# Patient Record
Sex: Female | Born: 1965 | Race: White | Hispanic: No | Smoking: Never smoker
Health system: Southern US, Community
[De-identification: ages and names within clinical notes are randomized; demographics above are authoritative.]

## PROBLEM LIST (undated history)

## (undated) DIAGNOSIS — Z8742 Personal history of other diseases of the female genital tract: Secondary | ICD-10-CM

## (undated) DIAGNOSIS — Z973 Presence of spectacles and contact lenses: Secondary | ICD-10-CM

## (undated) DIAGNOSIS — R519 Headache, unspecified: Secondary | ICD-10-CM

## (undated) DIAGNOSIS — Z9889 Other specified postprocedural states: Secondary | ICD-10-CM

## (undated) DIAGNOSIS — T4145XA Adverse effect of unspecified anesthetic, initial encounter: Secondary | ICD-10-CM

## (undated) DIAGNOSIS — R51 Headache: Secondary | ICD-10-CM

## (undated) DIAGNOSIS — T8859XA Other complications of anesthesia, initial encounter: Secondary | ICD-10-CM

## (undated) DIAGNOSIS — R112 Nausea with vomiting, unspecified: Secondary | ICD-10-CM

## (undated) HISTORY — PX: TONSILLECTOMY: SUR1361

## (undated) HISTORY — PX: DILATION AND CURETTAGE OF UTERUS: SHX78

---

## 2004-11-22 ENCOUNTER — Ambulatory Visit: Payer: Self-pay | Admitting: *Deleted

## 2006-03-21 ENCOUNTER — Ambulatory Visit: Payer: Self-pay | Admitting: Ophthalmology

## 2010-08-01 HISTORY — PX: OVARY SURGERY: SHX727

## 2012-04-10 ENCOUNTER — Ambulatory Visit: Payer: Self-pay | Admitting: *Deleted

## 2013-05-19 ENCOUNTER — Emergency Department: Payer: Self-pay | Admitting: Emergency Medicine

## 2013-05-19 LAB — URINALYSIS, COMPLETE
Bacteria: NONE SEEN
Bilirubin,UR: NEGATIVE
Glucose,UR: NEGATIVE mg/dL (ref 0–75)
Leukocyte Esterase: NEGATIVE
Nitrite: NEGATIVE
Ph: 6 (ref 4.5–8.0)
Protein: NEGATIVE
RBC,UR: 8 /HPF (ref 0–5)
Specific Gravity: 1.015 (ref 1.003–1.030)
Squamous Epithelial: 1
WBC UR: 1 /HPF (ref 0–5)

## 2013-05-19 LAB — CBC WITH DIFFERENTIAL/PLATELET
Basophil #: 0 10*3/uL (ref 0.0–0.1)
Eosinophil #: 0 10*3/uL (ref 0.0–0.7)
HCT: 40.6 % (ref 35.0–47.0)
HGB: 13.8 g/dL (ref 12.0–16.0)
Lymphocyte #: 0.6 10*3/uL — ABNORMAL LOW (ref 1.0–3.6)
Lymphocyte %: 5.8 %
MCH: 31.8 pg (ref 26.0–34.0)
MCHC: 34.1 g/dL (ref 32.0–36.0)
MCV: 93 fL (ref 80–100)
Monocyte #: 0.4 x10 3/mm (ref 0.2–0.9)
Monocyte %: 4.4 %
Neutrophil %: 89.2 %
Platelet: 183 10*3/uL (ref 150–440)
RBC: 4.35 10*6/uL (ref 3.80–5.20)
WBC: 9.8 10*3/uL (ref 3.6–11.0)

## 2013-05-19 LAB — BASIC METABOLIC PANEL
BUN: 14 mg/dL (ref 7–18)
Calcium, Total: 9.3 mg/dL (ref 8.5–10.1)
Chloride: 105 mmol/L (ref 98–107)
Co2: 28 mmol/L (ref 21–32)
Creatinine: 0.81 mg/dL (ref 0.60–1.30)
EGFR (Non-African Amer.): >60
Glucose: 146 mg/dL — ABNORMAL HIGH (ref 65–99)
Osmolality: 279 (ref 275–301)
Potassium: 3.7 mmol/L (ref 3.5–5.1)

## 2014-09-03 ENCOUNTER — Other Ambulatory Visit: Payer: Self-pay | Admitting: Orthopaedic Surgery

## 2014-09-08 NOTE — H&P (Signed)
Linda GrumblesDonna [name] is an 49 y.o. female.   Chief Complaint: Left Shoulder Pain AVW:[id number]:Signe continues with shoulder pain and she has been through the MRI scan since last time she was in. She continues in an exercise program and in formal therapy elsewhere. She feels her motion is better but she persists with the discomfort. Her pain is intermittent and severe.   MRI:  I reviewed an MRI scan films and report of a study done at [facility] on 08/26/14. She has some irritation of the supraspinatus tendon near the greater tuberosity.  No past medical history on file.  No past surgical history on file.  No family history on file. Social History:  has no tobacco, alcohol, and drug history on file.  Allergies: Allergies not on file  No prescriptions prior to admission    No results found for this or any previous visit (from the past 48 hour(s)). No results found.  Review of Systems  Musculoskeletal: Positive for joint pain.       Left shoulder.  All other systems reviewed and are negative.   There were no vitals taken for this visit. Physical Exam  Constitutional: She is oriented to person, place, and time. She appears well-developed and well-nourished.  HENT:  Head: Normocephalic and atraumatic.  Eyes: Conjunctivae are normal. Pupils are equal, round, and reactive to light.  Neck: Normal range of motion.  Cardiovascular: Normal rate and regular rhythm.   Respiratory: Effort normal.  GI: Soft.  Musculoskeletal:  Left shoulder motion is a little bit improved. She has forward flexion to maybe 110 with external rotation to about 45 compared to 60 on the opposite side. Her internal rotation is to her back pocket. She has impingement in both positions though cuff strength is fairly good. There is no pain at her [facility] joint. Cervical motion is full there is no palpable lymphadenopathy.   Neurological: She is alert and oriented to person, place, and time.  Skin: Skin is warm and dry.   Psychiatric: She has a normal mood and affect. Her behavior is normal. Judgment and thought content normal.     Assessment/Plan Assessment: Left shoulder adhesive capsulitis injected x2  Plan: Lupita LeashDonna has some irritation of her rotator cuff and a resolving adhesive capsulitis situation. She has been symptomatic for about a year. This persists despite supervised physical therapy and 2 injections. I think we can improve her situation with an arthroscopy and manipulation. I reviewed risk of anesthesia and infection related to this intervention. Our plan will be to perform a manipulation and an acromioplasty and debridement of the irritated cuff. We will then get her back in therapy in an expeditious fashion.  Arrin Pintor, Ginger OrganNDREW PAUL 09/08/2014, 9:52 AM

## 2014-09-10 ENCOUNTER — Encounter (HOSPITAL_BASED_OUTPATIENT_CLINIC_OR_DEPARTMENT_OTHER): Payer: Self-pay | Admitting: *Deleted

## 2014-09-12 ENCOUNTER — Encounter (HOSPITAL_BASED_OUTPATIENT_CLINIC_OR_DEPARTMENT_OTHER): Payer: Self-pay | Admitting: Certified Registered"

## 2014-09-12 ENCOUNTER — Encounter (HOSPITAL_BASED_OUTPATIENT_CLINIC_OR_DEPARTMENT_OTHER)
Admission: RE | Disposition: A | Discharge status: Home / Self Care | Payer: Self-pay | Source: Ambulatory Visit | Attending: Orthopaedic Surgery

## 2014-09-12 ENCOUNTER — Ambulatory Visit (HOSPITAL_BASED_OUTPATIENT_CLINIC_OR_DEPARTMENT_OTHER): Payer: 59 | Admitting: Certified Registered"

## 2014-09-12 ENCOUNTER — Ambulatory Visit (HOSPITAL_BASED_OUTPATIENT_CLINIC_OR_DEPARTMENT_OTHER)
Admission: RE | Admit: 2014-09-12 | Discharge: 2014-09-12 | Disposition: A | Discharge status: Home / Self Care | Payer: 59 | Source: Ambulatory Visit | Attending: Orthopaedic Surgery | Admitting: Orthopaedic Surgery

## 2014-09-12 DIAGNOSIS — M25812 Other specified joint disorders, left shoulder: Secondary | ICD-10-CM | POA: Insufficient documentation

## 2014-09-12 DIAGNOSIS — M7502 Adhesive capsulitis of left shoulder: Secondary | ICD-10-CM | POA: Insufficient documentation

## 2014-09-12 DIAGNOSIS — M25512 Pain in left shoulder: Secondary | ICD-10-CM | POA: Diagnosis present

## 2014-09-12 HISTORY — DX: Adverse effect of unspecified anesthetic, initial encounter: T41.45XA

## 2014-09-12 HISTORY — PX: SHOULDER ACROMIOPLASTY: SHX6093

## 2014-09-12 HISTORY — DX: Personal history of other diseases of the female genital tract: Z87.42

## 2014-09-12 HISTORY — DX: Presence of spectacles and contact lenses: Z97.3

## 2014-09-12 HISTORY — PX: SHOULDER ARTHROSCOPY: SHX128

## 2014-09-12 HISTORY — DX: Nausea with vomiting, unspecified: R11.2

## 2014-09-12 HISTORY — DX: Nausea with vomiting, unspecified: Z98.890

## 2014-09-12 HISTORY — DX: Other complications of anesthesia, initial encounter: T88.59XA

## 2014-09-12 LAB — POCT HEMOGLOBIN-HEMACUE: HEMOGLOBIN: 12.3 g/dL (ref 12.0–15.0)

## 2014-09-12 SURGERY — ARTHROSCOPY, SHOULDER
Anesthesia: Regional | Site: Shoulder | Laterality: Left

## 2014-09-12 MED ORDER — DEXAMETHASONE SODIUM PHOSPHATE 4 MG/ML IJ SOLN
INTRAMUSCULAR | Status: DC | PRN
  Administered 2014-09-12: 10 mg via INTRAVENOUS

## 2014-09-12 MED ORDER — MIDAZOLAM HCL 2 MG/2ML IJ SOLN
1.0000 mg | INTRAMUSCULAR | Status: DC | PRN
  Administered 2014-09-12: 2 mg via INTRAVENOUS

## 2014-09-12 MED ORDER — HYDROMORPHONE HCL 1 MG/ML IJ SOLN: 0.2500 mg | INTRAMUSCULAR | Status: DC | PRN

## 2014-09-12 MED ORDER — BUPIVACAINE-EPINEPHRINE (PF) 0.5% -1:200000 IJ SOLN
INTRAMUSCULAR | Status: DC | PRN
  Administered 2014-09-12: 30 mL via PERINEURAL

## 2014-09-12 MED ORDER — FENTANYL CITRATE 0.05 MG/ML IJ SOLN
INTRAMUSCULAR | Status: AC
  Filled 2014-09-12: qty 6

## 2014-09-12 MED ORDER — ONDANSETRON HCL 4 MG/2ML IJ SOLN
INTRAMUSCULAR | Status: DC | PRN
  Administered 2014-09-12: 4 mg via INTRAVENOUS

## 2014-09-12 MED ORDER — LACTATED RINGERS IV SOLN: INTRAVENOUS | Status: DC

## 2014-09-12 MED ORDER — HYDROCODONE-ACETAMINOPHEN 5-325 MG PO TABS: 1.0000 | ORAL_TABLET | Freq: Four times a day (QID) | ORAL | Status: AC | PRN

## 2014-09-12 MED ORDER — CEFAZOLIN SODIUM-DEXTROSE 2-3 GM-% IV SOLR
INTRAVENOUS | Status: AC
Start: 2014-09-12 — End: 2014-09-12
  Filled 2014-09-12: qty 50

## 2014-09-12 MED ORDER — FENTANYL CITRATE 0.05 MG/ML IJ SOLN
50.0000 ug | INTRAMUSCULAR | Status: DC | PRN
  Administered 2014-09-12: 100 ug via INTRAVENOUS

## 2014-09-12 MED ORDER — SUCCINYLCHOLINE CHLORIDE 20 MG/ML IJ SOLN
INTRAMUSCULAR | Status: DC | PRN
  Administered 2014-09-12: 100 mg via INTRAVENOUS

## 2014-09-12 MED ORDER — MIDAZOLAM HCL 2 MG/2ML IJ SOLN
INTRAMUSCULAR | Status: AC
  Filled 2014-09-12: qty 2

## 2014-09-12 MED ORDER — FENTANYL CITRATE 0.05 MG/ML IJ SOLN
INTRAMUSCULAR | Status: AC
  Filled 2014-09-12: qty 2

## 2014-09-12 MED ORDER — CHLORHEXIDINE GLUCONATE 4 % EX LIQD: 60.0000 mL | Freq: Once | CUTANEOUS | Status: DC

## 2014-09-12 MED ORDER — PROPOFOL 10 MG/ML IV EMUL
INTRAVENOUS | Status: AC
  Filled 2014-09-12: qty 50

## 2014-09-12 MED ORDER — MEPERIDINE HCL 25 MG/ML IJ SOLN: 6.2500 mg | INTRAMUSCULAR | Status: DC | PRN

## 2014-09-12 MED ORDER — LIDOCAINE HCL (CARDIAC) 20 MG/ML IV SOLN
INTRAVENOUS | Status: DC | PRN
  Administered 2014-09-12: 100 mg via INTRAVENOUS

## 2014-09-12 MED ORDER — LACTATED RINGERS IV SOLN
INTRAVENOUS | Status: DC
  Administered 2014-09-12 (×2): via INTRAVENOUS

## 2014-09-12 MED ORDER — PROPOFOL 10 MG/ML IV BOLUS
INTRAVENOUS | Status: DC | PRN
  Administered 2014-09-12: 150 mg via INTRAVENOUS

## 2014-09-12 MED ORDER — SCOPOLAMINE 1 MG/3DAYS TD PT72
MEDICATED_PATCH | TRANSDERMAL | Status: AC
  Filled 2014-09-12: qty 1

## 2014-09-12 MED ORDER — SCOPOLAMINE 1 MG/3DAYS TD PT72
1.0000 | MEDICATED_PATCH | TRANSDERMAL | Status: DC
  Administered 2014-09-12: 1.5 mg via TRANSDERMAL

## 2014-09-12 MED ORDER — CEFAZOLIN SODIUM-DEXTROSE 2-3 GM-% IV SOLR
2.0000 g | INTRAVENOUS | Status: AC
  Administered 2014-09-12: 2 g via INTRAVENOUS

## 2014-09-12 MED ORDER — CEFAZOLIN SODIUM-DEXTROSE 2-3 GM-% IV SOLR
INTRAVENOUS | Status: AC
  Filled 2014-09-12: qty 50

## 2014-09-12 MED ORDER — ONDANSETRON HCL 4 MG/2ML IJ SOLN: 4.0000 mg | Freq: Once | INTRAMUSCULAR | Status: DC | PRN

## 2014-09-12 SURGICAL SUPPLY — 65 items
BENZOIN TINCTURE PRP APPL 2/3 (GAUZE/BANDAGES/DRESSINGS) IMPLANT
BLADE CUDA 5.5 (BLADE) IMPLANT
BLADE GREAT WHITE 4.2 (BLADE) ×2 IMPLANT
BLADE SURG 15 STRL LF DISP TIS (BLADE) IMPLANT
BLADE SURG 15 STRL SS (BLADE)
BUR VERTEX HOODED 4.5 (BURR) IMPLANT
CANNULA SHOULDER 7CM (CANNULA) ×2 IMPLANT
CANNULA TWIST IN 8.25X7CM (CANNULA) IMPLANT
DECANTER SPIKE VIAL GLASS SM (MISCELLANEOUS) IMPLANT
DRAPE STERI 35X30 U-POUCH (DRAPES) ×2 IMPLANT
DRAPE U-SHAPE 47X51 STRL (DRAPES) ×2 IMPLANT
DRAPE U-SHAPE 76X120 STRL (DRAPES) ×4 IMPLANT
DRSG EMULSION OIL 3X3 NADH (GAUZE/BANDAGES/DRESSINGS) ×2 IMPLANT
DRSG PAD ABDOMINAL 8X10 ST (GAUZE/BANDAGES/DRESSINGS) ×2 IMPLANT
DURAPREP 26ML APPLICATOR (WOUND CARE) ×2 IMPLANT
ELECT MENISCUS 165MM 90D (ELECTRODE) IMPLANT
ELECT REM PT RETURN 9FT ADLT (ELECTROSURGICAL) ×2
ELECTRODE REM PT RTRN 9FT ADLT (ELECTROSURGICAL) ×1 IMPLANT
GAUZE SPONGE 4X4 12PLY STRL (GAUZE/BANDAGES/DRESSINGS) ×2 IMPLANT
GLOVE BIO SURGEON STRL SZ 6.5 (GLOVE) ×4 IMPLANT
GLOVE BIO SURGEON STRL SZ8 (GLOVE) ×4 IMPLANT
GLOVE BIOGEL PI IND STRL 7.0 (GLOVE) ×1 IMPLANT
GLOVE BIOGEL PI IND STRL 8 (GLOVE) ×2 IMPLANT
GLOVE BIOGEL PI INDICATOR 7.0 (GLOVE) ×1
GLOVE BIOGEL PI INDICATOR 8 (GLOVE) ×2
GOWN STRL REUS W/ TWL LRG LVL3 (GOWN DISPOSABLE) ×2 IMPLANT
GOWN STRL REUS W/ TWL XL LVL3 (GOWN DISPOSABLE) ×2 IMPLANT
GOWN STRL REUS W/TWL LRG LVL3 (GOWN DISPOSABLE) ×2
GOWN STRL REUS W/TWL XL LVL3 (GOWN DISPOSABLE) ×2
LIQUID BAND (GAUZE/BANDAGES/DRESSINGS) IMPLANT
MANIFOLD NEPTUNE II (INSTRUMENTS) ×2 IMPLANT
NDL SUT 6 .5 CRC .975X.05 MAYO (NEEDLE) IMPLANT
NEEDLE MAYO TAPER (NEEDLE)
NEEDLE SCORPION MULTI FIRE (NEEDLE) IMPLANT
NS IRRIG 1000ML POUR BTL (IV SOLUTION) IMPLANT
PACK ARTHROSCOPY DSU (CUSTOM PROCEDURE TRAY) ×2 IMPLANT
PACK BASIN DAY SURGERY FS (CUSTOM PROCEDURE TRAY) ×2 IMPLANT
PASSER SUT SWANSON 36MM LOOP (INSTRUMENTS) IMPLANT
PENCIL BUTTON HOLSTER BLD 10FT (ELECTRODE) IMPLANT
SET ARTHROSCOPY TUBING (MISCELLANEOUS) ×1
SET ARTHROSCOPY TUBING LN (MISCELLANEOUS) ×1 IMPLANT
SHEET MEDIUM DRAPE 40X70 STRL (DRAPES) ×2 IMPLANT
SLEEVE SCD COMPRESS KNEE MED (MISCELLANEOUS) ×2 IMPLANT
SLING ARM LRG ADULT FOAM STRAP (SOFTGOODS) IMPLANT
SLING ARM MED ADULT FOAM STRAP (SOFTGOODS) IMPLANT
SLING ARM SM FOAM STRAP (SOFTGOODS) IMPLANT
SLING ARM XL FOAM STRAP (SOFTGOODS) IMPLANT
SPONGE LAP 4X18 X RAY DECT (DISPOSABLE) IMPLANT
STRIP CLOSURE SKIN 1/2X4 (GAUZE/BANDAGES/DRESSINGS) IMPLANT
SUCTION FRAZIER TIP 10 FR DISP (SUCTIONS) IMPLANT
SUT ETHIBOND 2 OS 4 DA (SUTURE) IMPLANT
SUT ETHILON 3 0 PS 1 (SUTURE) ×2 IMPLANT
SUT FIBERWIRE #2 38 T-5 BLUE (SUTURE)
SUT PDS AB 2-0 CT2 27 (SUTURE) IMPLANT
SUT VIC AB 0 SH 27 (SUTURE) IMPLANT
SUT VIC AB 2-0 SH 27 (SUTURE)
SUT VIC AB 2-0 SH 27XBRD (SUTURE) IMPLANT
SUT VICRYL 4-0 PS2 18IN ABS (SUTURE) IMPLANT
SUTURE FIBERWR #2 38 T-5 BLUE (SUTURE) IMPLANT
SYR BULB 3OZ (MISCELLANEOUS) IMPLANT
TOWEL OR 17X24 6PK STRL BLUE (TOWEL DISPOSABLE) ×2 IMPLANT
TOWEL OR NON WOVEN STRL DISP B (DISPOSABLE) ×2 IMPLANT
WAND STAR VAC 90 (SURGICAL WAND) ×2 IMPLANT
WATER STERILE IRR 1000ML POUR (IV SOLUTION) ×2 IMPLANT
YANKAUER SUCT BULB TIP NO VENT (SUCTIONS) IMPLANT

## 2014-09-12 NOTE — Interval H&P Note (Signed)
OK for surgery PD 

## 2014-09-12 NOTE — Transfer of Care (Signed)
Immediate Anesthesia Transfer of Care Note  Patient: Linda [name]  Procedure(s) Performed: Procedure(s): ARTHROSCOPY SHOULDER WITH MANIPULATION (Left) SHOULDER ACROMIOPLASTY (Left)  Patient Location: PACU  Anesthesia Type:GA combined with regional for post-op pain  Level of Consciousness: awake, sedated and responds to stimulation  Airway & Oxygen Therapy: Patient Spontanous Breathing and Patient connected to face mask oxygen  Post-op Assessment: Report given to RN, Post -op Vital signs reviewed and stable and Patient moving all extremities  Post vital signs: Reviewed and stable  Last Vitals:  Filed Vitals:   09/12/14 1230  BP: 104/70  Pulse: 80  Temp:   Resp: 17    Complications: No apparent anesthesia complications

## 2014-09-12 NOTE — Anesthesia Preprocedure Evaluation (Signed)
Anesthesia Evaluation  Patient identified by MRN, date of birth, ID band Patient awake    Reviewed: Allergy & Precautions, NPO status , Patient's Chart, lab work & pertinent test results  History of Anesthesia Complications (+) PONV  Airway Mallampati: I  TM Distance: >3 FB Neck ROM: Full    Dental   Pulmonary          Cardiovascular     Neuro/Psych    GI/Hepatic   Endo/Other    Renal/GU      Musculoskeletal   Abdominal   Peds  Hematology   Anesthesia Other Findings   Reproductive/Obstetrics                             Anesthesia Physical Anesthesia Plan  ASA: II  Anesthesia Plan: General   Post-op Pain Management:    Induction: Intravenous  Airway Management Planned: Oral ETT  Additional Equipment:   Intra-op Plan:   Post-operative Plan: Extubation in OR  Informed Consent: I have reviewed the patients History and Physical, chart, labs and discussed the procedure including the risks, benefits and alternatives for the proposed anesthesia with the patient or authorized representative who has indicated his/her understanding and acceptance.     Plan Discussed with: CRNA and Surgeon  Anesthesia Plan Comments:         Anesthesia Quick Evaluation  

## 2014-09-12 NOTE — Op Note (Signed)
#  [id number] 

## 2014-09-12 NOTE — Anesthesia Postprocedure Evaluation (Signed)
Anesthesia Post Note  Patient: Linda GrumblesDonna Howdeshell  Procedure(s) Performed: Procedure(s) (LRB): ARTHROSCOPY SHOULDER WITH MANIPULATION (Left) SHOULDER ACROMIOPLASTY (Left)  Anesthesia type: general  Patient location: PACU  Post pain: Pain level controlled  Post assessment: Patient's Cardiovascular Status Stable  Last Vitals:  Filed Vitals:   09/12/14 1545  BP: 120/79  Pulse: 65  Temp: 36.6 C  Resp: 20    Post vital signs: Reviewed and stable  Level of consciousness: sedated  Complications: No apparent anesthesia complications

## 2014-09-12 NOTE — Discharge Instructions (Signed)
°  Post Anesthesia Home Care Instructions ° °Activity: °Get plenty of rest for the remainder of the day. A responsible adult should stay with you for 24 hours following the procedure.  °For the next 24 hours, DO NOT: °-Drive a car °-Operate machinery °-Drink alcoholic beverages °-Take any medication unless instructed by your physician °-Make any legal decisions or sign important papers. ° °Meals: °Start with liquid foods such as gelatin or soup. Progress to regular foods as tolerated. Avoid greasy, spicy, heavy foods. If nausea and/or vomiting occur, drink only clear liquids until the nausea and/or vomiting subsides. Call your physician if vomiting continues. ° °Special Instructions/Symptoms: °Your throat may feel dry or sore from the anesthesia or the breathing tube placed in your throat during surgery. If this causes discomfort, gargle with warm salt water. The discomfort should disappear within 24 hours. ° °Regional Anesthesia Blocks ° °1. Numbness or the inability to move the "blocked" extremity may last from 3-48 hours after placement. The length of time depends on the medication injected and your individual response to the medication. If the numbness is not going away after 48 hours, call your surgeon. ° °2. The extremity that is blocked will need to be protected until the numbness is gone and the  Strength has returned. Because you cannot feel it, you will need to take extra care to avoid injury. Because it may be weak, you may have difficulty moving it or using it. You may not know what position it is in without looking at it while the block is in effect. ° °3. For blocks in the legs and feet, returning to weight bearing and walking needs to be done carefully. You will need to wait until the numbness is entirely gone and the strength has returned. You should be able to move your leg and foot normally before you try and bear weight or walk. You will need someone to be with you when you first try to ensure you  do not fall and possibly risk injury. ° °4. Bruising and tenderness at the needle site are common side effects and will resolve in a few days. ° °5. Persistent numbness or new problems with movement should be communicated to the surgeon or the [facility] ([phone])/ [facility] ([phone]). °

## 2014-09-12 NOTE — Progress Notes (Signed)
AssistedDr. Ossey with left, ultrasound guided, interscalene  block. Side rails up, monitors on throughout procedure. See vital signs in flow sheet. Tolerated Procedure well.  

## 2014-09-12 NOTE — Anesthesia Procedure Notes (Addendum)
Anesthesia Regional Block:  Interscalene brachial plexus block  Pre-Anesthetic Checklist: ,, timeout performed, Correct Patient, Correct Site, Correct Laterality, Correct Procedure, Correct Position, site marked, Risks and benefits discussed,  Surgical consent,  Pre-op evaluation,  At surgeon's request and post-op pain management  Laterality: Left  Prep: chloraprep       Needles:  Injection technique: Single-shot  Needle Type: Echogenic Stimulator Needle     Needle Length: 9cm 9 cm Needle Gauge: 21 and 21 G    Additional Needles:  Procedures: ultrasound guided (picture in chart) and nerve stimulator Interscalene brachial plexus block  Nerve Stimulator or Paresthesia:  Response: 0.4 mA,   Additional Responses:   Narrative:  Start time: 09/12/2014 11:35 AM End time: 09/12/2014 11:45 AM Injection made incrementally with aspirations every 5 mL. Anesthesiologist: OSSEY, KEVIN  Additional Notes: Monitors applied. Patient sedated. Sterile prep and drape,hand hygiene and sterile gloves were used. Relevant anatomy identified.Needle position confirmed.Local anesthetic injected incrementally after negative aspiration. Local anesthetic spread visualized around nerve(s). Vascular puncture avoided. No complications. Image printed for medical record.The patient tolerated the procedure well.        Procedure Name: Intubation Date/Time: 09/12/2014 1:26 PM Performed by: CRAFT, JANET W Pre-anesthesia Checklist: Patient identified, Emergency Drugs available, Suction available and Patient being monitored Patient Re-evaluated:Patient Re-evaluated prior to inductionOxygen Delivery Method: Circle System Utilized Preoxygenation: Pre-oxygenation with 100% oxygen Intubation Type: IV induction Ventilation: Mask ventilation without difficulty Laryngoscope Size: Miller and 2 Grade View: Grade I Tube type: Oral Tube size: 7.0 mm Number of attempts: 1 Airway Equipment and Method: Stylet and LTA  kit utilized Placement Confirmation: ETT inserted through vocal cords under direct vision,  positive ETCO2 and breath sounds checked- equal and bilateral Secured at: 22 cm Tube secured with: Tape Dental Injury: Teeth and Oropharynx as per pre-operative assessment        

## 2014-09-15 NOTE — Op Note (Signed)
NAMECHRISTIANN, [name]                 ACCOUNT NO.:  [id number]  MEDICAL RECORD NO.:  [id number]  LOCATION:                                 FACILITY:  PHYSICIAN:  Lubertha Basque. Denilson Salminen, M.D.DATE OF BIRTH:  03-22-1966  DATE OF PROCEDURE:  09/12/2014 DATE OF DISCHARGE:  09/12/2014                              OPERATIVE REPORT   PREOPERATIVE DIAGNOSES: 1. Left shoulder impingement. 2. Left shoulder adhesive capsulitis.  POSTOPERATIVE DIAGNOSES: 1. Left shoulder impingement. 2. Left shoulder adhesive capsulitis.  PROCEDURE: 1. Left shoulder closed manipulation. 2. Left shoulder arthroscopic acromioplasty. 3. Left shoulder arthroscopic debridement.  ANESTHESIA:  General and block.  ATTENDING SURGEON:  Lubertha Basque. Jerl Santos, M.D.  ASSISTANT:  Elodia Florence, PA-C  INDICATION FOR PROCEDURE:  The patient is a 49 year old woman, who struggled for about a year with some left shoulder issues.  She has persisted with stiffness and pain despite multiple injections and therapy.  By MRI scan, she has things consistent with irritation of her rotator cuff and adhesive capsulitis.  At this point, she is offered a manipulation and arthroscopy.  Informed operative consent was obtained after discussion of possible complications including reaction to anesthesia and infection.  SUMMARY OF FINDINGS AND PROCEDURE:  Under general anesthesia and a block, a left shoulder manipulation was performed followed by an arthroscopy.  At manipulation, she did have some mild adhesions at the end range of forward flexion and external rotation which we were able to overcome.  At arthroscopy, she had no degenerative change in the glenohumeral joint and a normal-appearing rotator cuff and biceps tendon.  The interval appeared to be open.  In the subacromial space, she had a great deal of irritation and bursitis involving the bursal aspect of her cuff and a thorough debridement was done.  She had a moderately prominent  subacromial morphology addressed with an acromioplasty.  She was discharged home same day.  DESCRIPTION OF PROCEDURE:  The patient was taken to the operating suite where general anesthetic was applied without difficulty.  She was also given a block in the pre-anesthesia area.  She was positioned in beach- chair position and prepped and draped in normal sterile fashion.  After the administration of preop IV Kefzol and an appropriate time-out, an arthroscopy of the left shoulder was performed through total of 2 portals.  Findings were as noted above and procedure consisted predominantly of the acromioplasty which was done with a bur in the lateral position followed by transfer of the bur to the posterior position.  Also performed debridement mostly of the bursal aspect of her cuff.  The manipulation preoperatively did restore the end range of forward flexion and external rotation easily.  The shoulder was thoroughly irrigated followed by removal of arthroscopic equipment. Simple sutures of nylon were loosely placed on the portals followed by Adaptic, dry gauze and tape.  Estimated blood loss and fluids obtained from anesthesia records.  DISPOSITION:  The patient was extubated in the operating room and taken to recovery room in stable condition.  She was to go home same-day and follow up in the office in less than a week.  I will contact her by phone tonight.  Lubertha BasquePeter G. Jerl Santosalldorf, M.D.     PGD/MEDQ  D:  09/12/2014  T:  09/13/2014  Job:  [id number]

## 2014-09-16 ENCOUNTER — Encounter (HOSPITAL_BASED_OUTPATIENT_CLINIC_OR_DEPARTMENT_OTHER): Payer: Self-pay | Admitting: Orthopaedic Surgery

## 2015-10-26 ENCOUNTER — Ambulatory Visit
Admission: EM | Admit: 2015-10-26 | Discharge: 2015-10-26 | Disposition: A | Discharge status: Home / Self Care | Payer: 59 | Attending: Emergency Medicine | Admitting: Emergency Medicine

## 2015-10-26 DIAGNOSIS — R6889 Other general symptoms and signs: Secondary | ICD-10-CM

## 2015-10-26 LAB — RAPID INFLUENZA A&B ANTIGENS (ARMC ONLY)
INFLUENZA A (ARMC): NEGATIVE
INFLUENZA B (ARMC): NEGATIVE

## 2015-10-26 MED ORDER — OSELTAMIVIR PHOSPHATE 75 MG PO CAPS: 75.0000 mg | ORAL_CAPSULE | Freq: Two times a day (BID) | ORAL | Status: AC

## 2015-10-26 MED ORDER — ONDANSETRON HCL 4 MG PO TABS: 4.0000 mg | ORAL_TABLET | Freq: Four times a day (QID) | ORAL | Status: AC

## 2015-10-26 NOTE — ED Provider Notes (Signed)
CSN: [id number]     Arrival date & time 10/26/15  1201 History   None    Chief Complaint  Patient presents with  . Generalized Body Aches   (Consider location/radiation/quality/duration/timing/severity/associated sxs/prior Treatment) Patient is a 50 y.o. female presenting with URI. The history is provided by the patient. No language interpreter was used.  URI Presenting symptoms: congestion, fatigue and sore throat   Presenting symptoms: no cough, no ear pain, no fever and no rhinorrhea   Severity:  Moderate Onset quality:  Sudden Duration:  1 day Timing:  Constant Progression:  Unchanged Chronicity:  New Relieved by:  Nothing Ineffective treatments:  OTC medications and rest Associated symptoms: myalgias and sneezing   Associated symptoms: no headaches   Risk factors: sick contacts   Risk factors comment:  Recent exposure to flu   Past Medical History  Diagnosis Date  . Wears glasses   . History of endometriosis   . Complication of anesthesia     hard to wake up  . PONV (postoperative nausea and vomiting)    Past Surgical History  Procedure Laterality Date  . Dilation and curettage of uterus      x2  . Ovary surgery  2012    cyst-rt removed  . Tonsillectomy    . Shoulder arthroscopy Left 09/12/2014    Procedure: ARTHROSCOPY SHOULDER WITH MANIPULATION;  Surgeon: Velna Ochs, MD;  Location: [facility];  Service: Orthopedics;  Laterality: Left;  . Shoulder acromioplasty Left 09/12/2014    Procedure: SHOULDER ACROMIOPLASTY;  Surgeon: Velna Ochs, MD;  Location:  SURGERY CENTER;  Service: Orthopedics;  Laterality: Left;   History reviewed. No pertinent family history. Social History  Substance Use Topics  . Smoking status: Never Smoker   . Smokeless tobacco: None  . Alcohol Use: Yes     Comment: occ   OB History    No data available     Review of Systems  Constitutional: Positive for fatigue. Negative for fever and chills.   HENT: Positive for congestion, sneezing and sore throat. Negative for ear pain and rhinorrhea.   Eyes: Negative.   Respiratory: Negative for cough.   Gastrointestinal: Negative for nausea and vomiting.  Endocrine: Negative.   Genitourinary: Negative for dysuria.  Musculoskeletal: Positive for myalgias.  Skin: Negative for rash.  Allergic/Immunologic: Negative.   Neurological: Negative for headaches.  Hematological: Negative.   Psychiatric/Behavioral: Negative.   All other systems reviewed and are negative.   Allergies  Review of patient's allergies indicates no known allergies.  Home Medications   Prior to Admission medications   Medication Sig Start Date End Date Taking? Authorizing Provider  fexofenadine-pseudoephedrine (ALLEGRA-D 24) 180-240 MG 24 hr tablet Take 1 tablet by mouth daily.   Yes Historical Provider, MD  MELATONIN PO Take by mouth as needed.   Yes Historical Provider, MD  calcium carbonate (OS-CAL) 600 MG TABS tablet Take 600 mg by mouth 2 (two) times daily with a meal.    Historical Provider, MD  cholecalciferol (VITAMIN D) 1000 UNITS tablet Take 1,000 Units by mouth daily.    Historical Provider, MD  HYDROcodone-acetaminophen (NORCO) 5-325 MG per tablet Take 1-2 tablets by mouth every 6 (six) hours as needed for moderate pain. 09/12/14   Elodia Florence, PA-C  Multiple Vitamins-Minerals (MULTIVITAMIN WITH MINERALS) tablet Take 1 tablet by mouth daily.    Historical Provider, MD  ondansetron (ZOFRAN) 4 MG tablet Take 1 tablet (4 mg total) by mouth every 6 (six) hours. 10/26/15  Clancy GourdJeanette Shannel Zahm, NP  oseltamivir (TAMIFLU) 75 MG capsule Take 1 capsule (75 mg total) by mouth every 12 (twelve) hours. 10/26/15   Clancy GourdJeanette Roy Tokarz, NP  vitamin C (ASCORBIC ACID) 500 MG tablet Take 500 mg by mouth daily.    Historical Provider, MD   Meds Ordered and Administered this Visit  Medications - No data to display  BP 109/83 mmHg  Pulse 80  Temp(Src) 98.3 F (36.8 C) (Oral)  Resp  18  Ht 5\' 3"  (1.6 m)  Wt 133 lb (60.328 kg)  BMI 23.57 kg/m2  SpO2 100%  LMP 10/18/2015 No data found.   Physical Exam  Constitutional: She is oriented to person, place, and time. Vital signs are normal. She appears well-developed and well-nourished. She is active and cooperative. No distress.  HENT:  Head: Normocephalic.  Right Ear: Tympanic membrane and external ear normal.  Left Ear: Tympanic membrane and external ear normal.  Nose: Mucosal edema and rhinorrhea present. Right sinus exhibits no maxillary sinus tenderness and no frontal sinus tenderness. Left sinus exhibits no maxillary sinus tenderness and no frontal sinus tenderness.  Mouth/Throat: Uvula is midline and mucous membranes are normal. Posterior oropharyngeal erythema present. No oropharyngeal exudate, posterior oropharyngeal edema or tonsillar abscesses.  Eyes: Conjunctivae, EOM and lids are normal. Pupils are equal, round, and reactive to light.  Neck: Normal range of motion. No tracheal deviation present.  Cardiovascular: Normal rate, regular rhythm, normal heart sounds and normal pulses.   No murmur heard. Pulmonary/Chest: Effort normal and breath sounds normal.  Abdominal: Soft. Normal appearance and bowel sounds are normal. There is no tenderness. There is no rebound.  Musculoskeletal: Normal range of motion.  Lymphadenopathy:    She has no cervical adenopathy.  Neurological: She is alert and oriented to person, place, and time. No cranial nerve deficit or sensory deficit. GCS eye subscore is 4. GCS verbal subscore is 5. GCS motor subscore is 6.  Skin: Skin is warm, dry and intact. No rash noted.  Psychiatric: She has a normal mood and affect. Her speech is normal and behavior is normal.  Nursing note and vitals reviewed.   ED Course  Procedures (including critical care time)  Labs Review Labs Reviewed  RAPID INFLUENZA A&B ANTIGENS [facility] ONLY)   Results for orders placed or performed during the hospital  encounter of 10/26/15  Rapid Influenza A&B Antigens ([facility] only)  Result Value Ref Range   Influenza A ([facility]) NEGATIVE NEGATIVE   Influenza B ([facility]) NEGATIVE NEGATIVE   Imaging Review No results found.    MDM   1. Flu-like symptoms    1450: Discussed results with pt (flu test negative) and plan of care: rest,push fludis, will treat with Tamiflu/Zofran. Follow up with PCP, Rosemarie AxNoelle Robertson @[facility] [place] in 2-3 days if new or worsening issues. Return to Urgent Care as needed. Work note given. Pt verbalized understanding to this provider.     Clancy GourdJeanette Arna Luis, NP 10/26/15 1505

## 2015-10-26 NOTE — ED Notes (Addendum)
Patient c/o body aches, nausea, headache, and had some diarrhea yesterday.  These symptoms started yesterday morning.   Lastly, she says that she has been around people who have the Flu and Strep Throat but she states that her throat does not hurt and declines a throat swab.

## 2015-10-26 NOTE — Discharge Instructions (Signed)
Please take meds as directed. Rest, push fluids. May alternate tylenol/ibuprofen as label directed. Follow up with your PCP, Merilynn Finlandobertson, in 2-3 days if no improvement, sooner if worse.

## 2016-05-13 ENCOUNTER — Other Ambulatory Visit: Payer: Self-pay | Admitting: Neurology

## 2016-05-13 DIAGNOSIS — G43809 Other migraine, not intractable, without status migrainosus: Secondary | ICD-10-CM

## 2016-05-13 DIAGNOSIS — G43109 Migraine with aura, not intractable, without status migrainosus: Principal | ICD-10-CM

## 2016-05-27 ENCOUNTER — Ambulatory Visit
Admission: RE | Admit: 2016-05-27 | Discharge: 2016-05-27 | Disposition: A | Discharge status: Home / Self Care | Payer: 59 | Source: Ambulatory Visit | Attending: Neurology | Admitting: Neurology

## 2016-05-27 DIAGNOSIS — G43809 Other migraine, not intractable, without status migrainosus: Secondary | ICD-10-CM

## 2016-05-27 DIAGNOSIS — G43109 Migraine with aura, not intractable, without status migrainosus: Secondary | ICD-10-CM | POA: Insufficient documentation

## 2016-05-27 MED ORDER — GADOBENATE DIMEGLUMINE 529 MG/ML IV SOLN
10.0000 mL | Freq: Once | INTRAVENOUS | Status: AC | PRN
  Administered 2016-05-27: 10 mL via INTRAVENOUS

## 2017-03-15 ENCOUNTER — Other Ambulatory Visit: Payer: Self-pay

## 2017-03-15 ENCOUNTER — Telehealth: Payer: Self-pay

## 2017-03-15 DIAGNOSIS — Z1211 Encounter for screening for malignant neoplasm of colon: Secondary | ICD-10-CM

## 2017-03-15 NOTE — Telephone Encounter (Signed)
Gastroenterology Pre-Procedure Review  Request Date: 03/24/17 Requesting Physician: Dr. Servando SnareWohl  PATIENT REVIEW QUESTIONS: The patient responded to the following health history questions as indicated:   0. Patient is not being treated for any illnesses at this time. 1. Are you having any GI issues? no 2. Do you have a personal history of Polyps? no 3. Do you have a family history of Colon Cancer or Polyps? yes (grandmother and grandfather colon cancer) 4. Diabetes Mellitus? no 5. Joint replacements in the past 12 months?no 6. Major health problems in the past 3 months?no 7. Any artificial heart valves, MVP, or defibrillator?no    MEDICATIONS & ALLERGIES:    Patient reports the following regarding taking any anticoagulation/antiplatelet therapy:   Plavix, Coumadin, Eliquis, Xarelto, Lovenox, Pradaxa, Brilinta, or Effient? no Aspirin? no  Patient confirms/reports the following medications:  Current Outpatient Prescriptions  Medication Sig Dispense Refill  . calcium carbonate (OS-CAL) 600 MG TABS tablet Take 600 mg by mouth 2 (two) times daily with a meal.    . cholecalciferol (VITAMIN D) 1000 UNITS tablet Take 1,000 Units by mouth daily.    . fexofenadine-pseudoephedrine (ALLEGRA-D 24) 180-240 MG 24 hr tablet Take 1 tablet by mouth daily.    Marland Kitchen. HYDROcodone-acetaminophen (NORCO) 5-325 MG per tablet Take 1-2 tablets by mouth every 6 (six) hours as needed for moderate pain. 40 tablet 0  . MELATONIN PO Take by mouth as needed.    . Multiple Vitamins-Minerals (MULTIVITAMIN WITH MINERALS) tablet Take 1 tablet by mouth daily.    . ondansetron (ZOFRAN) 4 MG tablet Take 1 tablet (4 mg total) by mouth every 6 (six) hours. 12 tablet 0  . oseltamivir (TAMIFLU) 75 MG capsule Take 1 capsule (75 mg total) by mouth every 12 (twelve) hours. 10 capsule 0  . vitamin C (ASCORBIC ACID) 500 MG tablet Take 500 mg by mouth daily.     No current facility-administered medications for this visit.     Patient  confirms/reports the following allergies:  No Known Allergies  No orders of the defined types were placed in this encounter.   AUTHORIZATION INFORMATION Primary Insurance: 1D#: Group #:  Secondary Insurance: 1D#: Group #:  SCHEDULE INFORMATION: Date:  Time: Location:

## 2017-03-24 ENCOUNTER — Ambulatory Visit: Payer: 59 | Admitting: Anesthesiology

## 2017-03-24 ENCOUNTER — Ambulatory Visit
Admission: RE | Admit: 2017-03-24 | Discharge: 2017-03-24 | Disposition: A | Discharge status: Home / Self Care | Payer: 59 | Source: Ambulatory Visit | Attending: Gastroenterology | Admitting: Gastroenterology

## 2017-03-24 ENCOUNTER — Encounter
Admission: RE | Disposition: A | Discharge status: Home / Self Care | Payer: Self-pay | Source: Ambulatory Visit | Attending: Gastroenterology

## 2017-03-24 DIAGNOSIS — Z79899 Other long term (current) drug therapy: Secondary | ICD-10-CM | POA: Insufficient documentation

## 2017-03-24 DIAGNOSIS — Z1211 Encounter for screening for malignant neoplasm of colon: Secondary | ICD-10-CM

## 2017-03-24 DIAGNOSIS — K640 First degree hemorrhoids: Secondary | ICD-10-CM | POA: Diagnosis not present

## 2017-03-24 HISTORY — DX: Headache, unspecified: R51.9

## 2017-03-24 HISTORY — DX: Headache: R51

## 2017-03-24 HISTORY — PX: COLONOSCOPY WITH PROPOFOL: SHX5780

## 2017-03-24 SURGERY — COLONOSCOPY WITH PROPOFOL
Anesthesia: General

## 2017-03-24 MED ORDER — STERILE WATER FOR IRRIGATION IR SOLN
Status: DC | PRN
  Administered 2017-03-24: 08:00:00

## 2017-03-24 MED ORDER — PROPOFOL 10 MG/ML IV BOLUS
INTRAVENOUS | Status: DC | PRN
  Administered 2017-03-24: 30 mg via INTRAVENOUS
  Administered 2017-03-24: 20 mg via INTRAVENOUS
  Administered 2017-03-24: 80 mg via INTRAVENOUS
  Administered 2017-03-24 (×2): 20 mg via INTRAVENOUS

## 2017-03-24 MED ORDER — LIDOCAINE HCL (CARDIAC) 20 MG/ML IV SOLN
INTRAVENOUS | Status: DC | PRN
  Administered 2017-03-24: 50 mg via INTRAVENOUS

## 2017-03-24 MED ORDER — ACETAMINOPHEN 160 MG/5ML PO SOLN: 325.0000 mg | ORAL | Status: DC | PRN

## 2017-03-24 MED ORDER — ACETAMINOPHEN 325 MG PO TABS: 325.0000 mg | ORAL_TABLET | ORAL | Status: DC | PRN

## 2017-03-24 MED ORDER — SODIUM CHLORIDE 0.9 % IV SOLN: INTRAVENOUS | Status: DC

## 2017-03-24 MED ORDER — LACTATED RINGERS IV SOLN
INTRAVENOUS | Status: DC
  Administered 2017-03-24: 08:00:00 via INTRAVENOUS

## 2017-03-24 SURGICAL SUPPLY — 23 items

## 2017-03-24 NOTE — Discharge Instructions (Signed)
General Anesthesia, Adult, Care After °These instructions provide you with information about caring for yourself after your procedure. Your health care provider may also give you more specific instructions. Your treatment has been planned according to current medical practices, but problems sometimes occur. Call your health care provider if you have any problems or questions after your procedure. °What can I expect after the procedure? °After the procedure, it is common to have: °· Vomiting. °· A sore throat. °· Mental slowness. ° °It is common to feel: °· Nauseous. °· Cold or shivery. °· Sleepy. °· Tired. °· Sore or achy, even in parts of your body where you did not have surgery. ° °Follow these instructions at home: °For at least 24 hours after the procedure: °· Do not: °? Participate in activities where you could fall or become injured. °? Drive. °? Use heavy machinery. °? Drink alcohol. °? Take sleeping pills or medicines that cause drowsiness. °? Make important decisions or sign legal documents. °? Take care of children on your own. °· Rest. °Eating and drinking °· If you vomit, drink water, juice, or soup when you can drink without vomiting. °· Drink enough fluid to keep your urine clear or pale yellow. °· Make sure you have little or no nausea before eating solid foods. °· Follow the diet recommended by your health care provider. °General instructions °· Have a responsible adult stay with you until you are awake and alert. °· Return to your normal activities as told by your health care provider. Ask your health care provider what activities are safe for you. °· Take over-the-counter and prescription medicines only as told by your health care provider. °· If you smoke, do not smoke without supervision. °· Keep all follow-up visits as told by your health care provider. This is important. °Contact a health care provider if: °· You continue to have nausea or vomiting at home, and medicines are not helpful. °· You  cannot drink fluids or start eating again. °· You cannot urinate after 8-12 hours. °· You develop a skin rash. °· You have fever. °· You have increasing redness at the site of your procedure. °Get help right away if: °· You have difficulty breathing. °· You have chest pain. °· You have unexpected bleeding. °· You feel that you are having a life-threatening or urgent problem. °This information is not intended to replace advice given to you by your health care provider. Make sure you discuss any questions you have with your health care provider. °Document Released: 10/24/2000 Document Revised: 12/21/2015 Document Reviewed: 07/02/2015 °Elsevier Interactive Patient Education © 2018 [employer] ° °

## 2017-03-24 NOTE — H&P (Signed)
Midge Minium, MD [facility] [address], [address] [place], [place] [address] Phone: [phone] Fax : [phone]  Primary Care Physician:  Rosemarie Ax, MD Primary Gastroenterologist:  Dr. Servando Snare  Pre-Procedure History & Physical: HPI:  Linda [name] is a 51 y.o. female is here for a screening colonoscopy.   Past Medical History:  Diagnosis Date  . Complication of anesthesia    hard to wake up  . Headache   . History of endometriosis   . PONV (postoperative nausea and vomiting)   . Wears glasses     Past Surgical History:  Procedure Laterality Date  . DILATION AND CURETTAGE OF UTERUS     x2  . OVARY SURGERY  2012   cyst-rt removed  . SHOULDER ACROMIOPLASTY Left 09/12/2014   Procedure: SHOULDER ACROMIOPLASTY;  Surgeon: Velna Ochs, MD;  Location: [facility];  Service: Orthopedics;  Laterality: Left;  . SHOULDER ARTHROSCOPY Left 09/12/2014   Procedure: ARTHROSCOPY SHOULDER WITH MANIPULATION;  Surgeon: Velna Ochs, MD;  Location: [facility];  Service: Orthopedics;  Laterality: Left;  . TONSILLECTOMY      Prior to Admission medications   Medication Sig Start Date End Date Taking? Authorizing Provider  calcium carbonate (OS-CAL) 600 MG TABS tablet Take 600 mg by mouth 2 (two) times daily with a meal.    [provider]  cholecalciferol (VITAMIN D) 1000 UNITS tablet Take 1,000 Units by mouth daily.    [provider]  fexofenadine-pseudoephedrine (ALLEGRA-D 24) 180-240 MG 24 hr tablet Take 1 tablet by mouth daily.    [provider]  HYDROcodone-acetaminophen (NORCO) 5-325 MG per tablet Take 1-2 tablets by mouth every 6 (six) hours as needed for moderate pain. Patient not taking: Reported on 03/16/2017 09/12/14   Elodia Florence, PA-C  MELATONIN PO Take by mouth as needed.    [provider]  Multiple Vitamins-Minerals (MULTIVITAMIN WITH MINERALS) tablet Take 1 tablet by mouth daily.    [provider]  ondansetron (ZOFRAN) 4 MG tablet Take 1 tablet (4 mg total) by mouth every 6 (six) hours. Patient not taking: Reported on 03/16/2017 10/26/15   Defelice, Para March, NP  oseltamivir (TAMIFLU) 75 MG capsule Take 1 capsule (75 mg total) by mouth every 12 (twelve) hours. Patient not taking: Reported on 03/16/2017 10/26/15   Defelice, Para March, NP  vitamin C (ASCORBIC ACID) 500 MG tablet Take 500 mg by mouth daily.    [provider]    Allergies as of 03/15/2017  . (No Known Allergies)    History reviewed. No pertinent family history.  Social History   Social History  . Marital status: Significant Other    Spouse name: N/A  . Number of children: N/A  . Years of education: N/A   Occupational History  . Not on file.   Social History Main Topics  . Smoking status: Never Smoker  . Smokeless tobacco: Never Used  . Alcohol use Yes     Comment: occ  . Drug use: No  . Sexual activity: Not on file   Other Topics Concern  . Not on file   Social History Narrative  . No narrative on file    Review of Systems: See HPI, otherwise negative ROS  Physical Exam: BP 121/83   Pulse 70   Temp 97.7 F (36.5 C) (Temporal)   Resp 16   Ht 5\' 3"  (1.6 m)   Wt 131 lb (59.4 kg)   LMP 03/19/2017 Comment: preg test neg  SpO2 99%   BMI 23.21 kg/m  General:   Alert,  pleasant and cooperative in NAD Head:  Normocephalic and atraumatic. Neck:  Supple; no masses or thyromegaly. Lungs:  Clear throughout to auscultation.    Heart:  Regular rate and rhythm. Abdomen:  Soft, nontender and nondistended. Normal bowel sounds, without guarding, and without rebound.   Neurologic:  Alert and  oriented x4;  grossly normal neurologically.  Impression/Plan: Linda [name] is now here to undergo a screening colonoscopy.  Risks, benefits, and alternatives regarding colonoscopy have been reviewed with the patient.  Questions have been answered.  All parties agreeable.

## 2017-03-24 NOTE — Anesthesia Preprocedure Evaluation (Signed)
Anesthesia Evaluation  Patient identified by MRN, date of birth, ID band Patient awake    Reviewed: Allergy & Precautions, H&P , NPO status , Patient's Chart, lab work & pertinent test results, reviewed documented beta blocker date and time   History of Anesthesia Complications (+) PONV and history of anesthetic complications  Airway Mallampati: II  TM Distance: >3 FB Neck ROM: full    Dental no notable dental hx.    Pulmonary neg pulmonary ROS,    Pulmonary exam normal breath sounds clear to auscultation       Cardiovascular Exercise Tolerance: Good negative cardio ROS   Rhythm:regular Rate:Normal     Neuro/Psych  Headaches, negative psych ROS   GI/Hepatic negative GI ROS, Neg liver ROS,   Endo/Other  negative endocrine ROS  Renal/GU negative Renal ROS  negative genitourinary   Musculoskeletal   Abdominal   Peds  Hematology negative hematology ROS (+)   Anesthesia Other Findings   Reproductive/Obstetrics negative OB ROS                             Anesthesia Physical Anesthesia Plan  ASA: II  Anesthesia Plan: General   Post-op Pain Management:    Induction:   PONV Risk Score and Plan: Ondansetron and Propofol infusion  Airway Management Planned:   Additional Equipment:   Intra-op Plan:   Post-operative Plan:   Informed Consent: I have reviewed the patients History and Physical, chart, labs and discussed the procedure including the risks, benefits and alternatives for the proposed anesthesia with the patient or authorized representative who has indicated his/her understanding and acceptance.   Dental Advisory Given  Plan Discussed with: CRNA and Anesthesiologist  Anesthesia Plan Comments:         Anesthesia Quick Evaluation

## 2017-03-24 NOTE — Transfer of Care (Signed)
Immediate Anesthesia Transfer of Care Note  Patient: Linda [name]  Procedure(s) Performed: Procedure(s): COLONOSCOPY WITH PROPOFOL (N/A)  Patient Location: PACU  Anesthesia Type: General  Level of Consciousness: awake, alert  and patient cooperative  Airway and Oxygen Therapy: Patient Spontanous Breathing and Patient connected to supplemental oxygen  Post-op Assessment: Post-op Vital signs reviewed, Patient's Cardiovascular Status Stable, Respiratory Function Stable, Patent Airway and No signs of Nausea or vomiting  Post-op Vital Signs: Reviewed and stable  Complications: No apparent anesthesia complications

## 2017-03-24 NOTE — Anesthesia Postprocedure Evaluation (Signed)
Anesthesia Post Note  Patient: Linda [name]  Procedure(s) Performed: Procedure(s) (LRB): COLONOSCOPY WITH PROPOFOL (N/A)  Patient location during evaluation: PACU Anesthesia Type: General Level of consciousness: awake and alert Pain management: pain level controlled Vital Signs Assessment: post-procedure vital signs reviewed and stable Respiratory status: spontaneous breathing, nonlabored ventilation, respiratory function stable and patient connected to nasal cannula oxygen Cardiovascular status: blood pressure returned to baseline and stable Postop Assessment: no signs of nausea or vomiting Anesthetic complications: no    Alta Corning

## 2017-03-24 NOTE — Op Note (Signed)
[facility] Gastroenterology Patient Name: Linda [name] Procedure Date: 03/24/2017 7:54 AM MRN: [id number] Account #: [id number] Date of Birth: January 04, 1966 Admit Type: Outpatient Age: 51 Room: [facility] OR ROOM 01 Gender: Female Note Status: Finalized Procedure:            Colonoscopy Indications:          Screening for colorectal malignant neoplasm Providers:            Midge Minium MD, MD Medicines:            Propofol per Anesthesia Complications:        No immediate complications. Procedure:            Pre-Anesthesia Assessment:                       - Prior to the procedure, a History and Physical was                        performed, and patient medications and allergies were                        reviewed. The patient's tolerance of previous                        anesthesia was also reviewed. The risks and benefits of                        the procedure and the sedation options and risks were                        discussed with the patient. All questions were                        answered, and informed consent was obtained. Prior                        Anticoagulants: The patient has taken no previous                        anticoagulant or antiplatelet agents. ASA Grade                        Assessment: II - A patient with mild systemic disease.                        After reviewing the risks and benefits, the patient was                        deemed in satisfactory condition to undergo the                        procedure.                       After obtaining informed consent, the colonoscope was                        passed under direct vision. Throughout the procedure,                        the patient's blood pressure,  pulse, and oxygen                        saturations were monitored continuously. The Olympus                        CF-HQ190L Colonoscope (S#. [id number]) was introduced                        through the anus and advanced to the the  cecum,                        identified by appendiceal orifice and ileocecal valve.                        The colonoscopy was performed without difficulty. The                        patient tolerated the procedure well. The quality of                        the bowel preparation was excellent. Findings:      The perianal and digital rectal examinations were normal.      Non-bleeding internal hemorrhoids were found during retroflexion. The       hemorrhoids were Grade I (internal hemorrhoids that do not prolapse). Impression:           - Non-bleeding internal hemorrhoids.                       - No specimens collected. Recommendation:       - Discharge patient to home.                       - Resume previous diet.                       - Continue present medications.                       - Repeat colonoscopy in 10 years for screening unless                        any change in family history or lower GI problems. Procedure Code(s):    --- Professional ---                       [phone], Colonoscopy, flexible; diagnostic, including                        collection of specimen(s) by brushing or washing, when                        performed (separate procedure) Diagnosis Code(s):    --- Professional ---                       Z12.11, Encounter for screening for malignant neoplasm                        of colon CPT copyright 2016 [employer]. All rights reserved. The codes documented in this report are preliminary and upon coder review may  be revised to meet current compliance requirements. Midge Minium MD, MD 03/24/2017 8:14:47 AM This report has been signed electronically. Number of Addenda: 0 Note Initiated On: 03/24/2017 7:54 AM Scope Withdrawal Time: 0 hours 6 minutes 20 seconds  Total Procedure Duration: 0 hours 9 minutes 54 seconds       [facility]

## 2017-03-24 NOTE — Anesthesia Procedure Notes (Signed)
Performed by: Jerimyah Vandunk Pre-anesthesia Checklist: Patient identified, Emergency Drugs available, Suction available, Timeout performed and Patient being monitored Patient Re-evaluated:Patient Re-evaluated prior to induction Oxygen Delivery Method: Nasal cannula Placement Confirmation: positive ETCO2       

## 2017-03-27 ENCOUNTER — Encounter: Payer: Self-pay | Admitting: Gastroenterology

## 2017-04-27 IMAGING — MR MR HEAD WO/W CM
10 of 11 series · 39 of 48 positions shown · IV contrast (10 ML MULTIHANCE)
Comparison: MRI 03/21/2006

CLINICAL DATA: Vestibular migraine

EXAM:
MRI HEAD WITHOUT AND WITH CONTRAST
TECHNIQUE: Multiplanar, multiecho pulse sequences of the brain and surrounding
structures were obtained without and with intravenous contrast.
CONTRAST:  10mL MULTIHANCE GADOBENATE DIMEGLUMINE 529 MG/ML IV SOLN

[Series 2: T1 · sagittal · 5.0mm · 0.45mm/px · 2 of 23 slices shown]
[im 1/23]
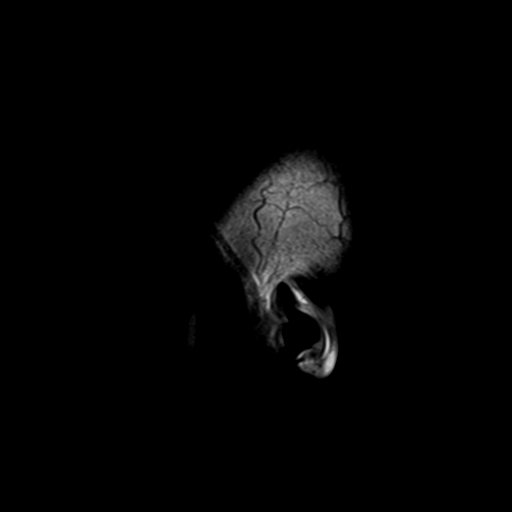
[im 8/23]
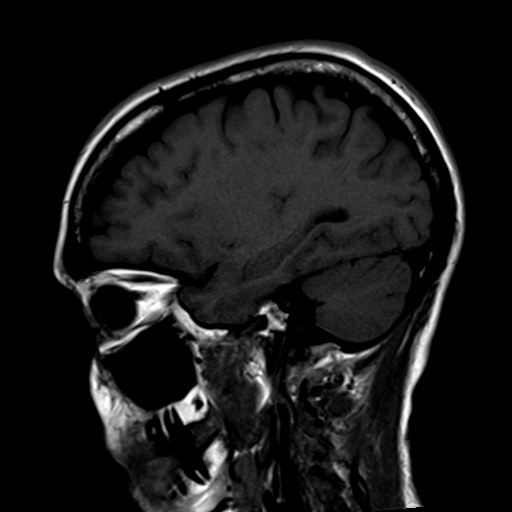

[Series 4: DWI · axial · 3.0mm · 1.20mm/px · z∈[-42,+120]mm · 6 of 55 slices shown (1 of 2)]
[im 1/55]
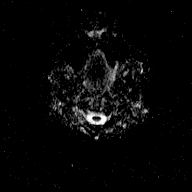
[im 11/55]
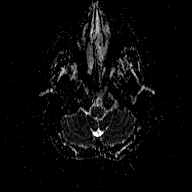
[im 22/55]
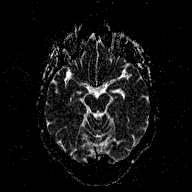
[im 33/55]
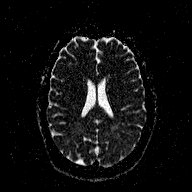
[im 44/55]
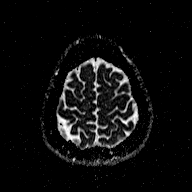
[im 55/55]
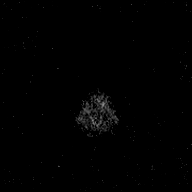

[Series 5: T2 · axial · 5.0mm · 0.72mm/px · z∈[-39,+117]mm · 3 of 25 slices shown (1 of 2)]
[im 1/25]
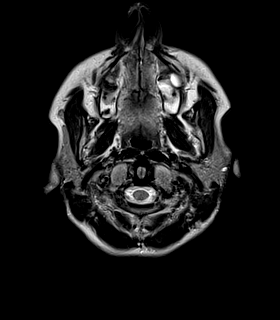
[im 13/25]
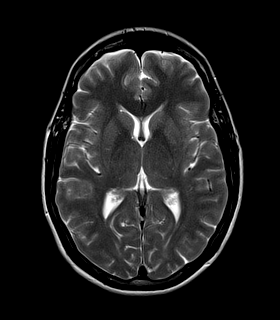
[im 25/25]
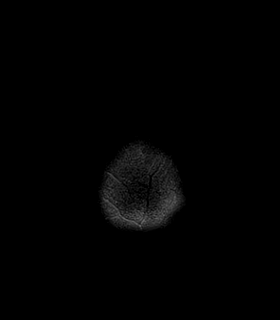

[Series 6: FLAIR · axial · 5.0mm · 0.45mm/px · z∈[-39,+117]mm · 3 of 25 slices shown (1 of 2)]
[im 1/25]
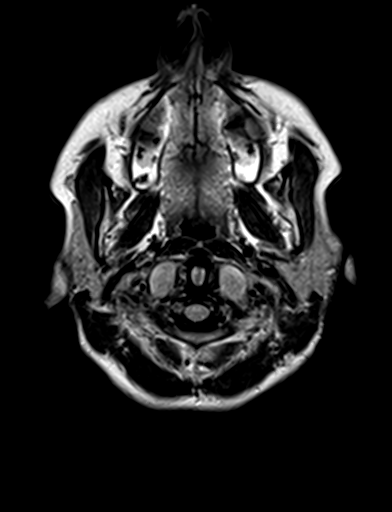
[im 13/25]
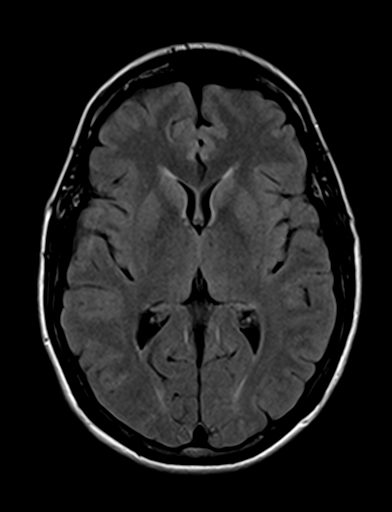
[im 25/25]
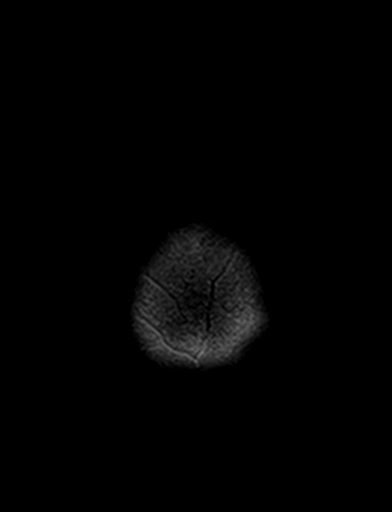

[Series 7: T2 · axial · 5.0mm · 0.72mm/px · z∈[-39,+117]mm · 3 of 25 slices shown (2 of 2)]
[im 1/25]
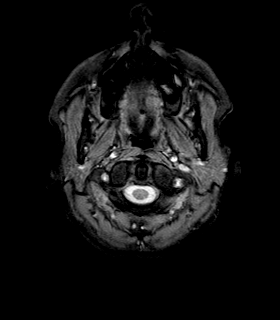
[im 13/25]
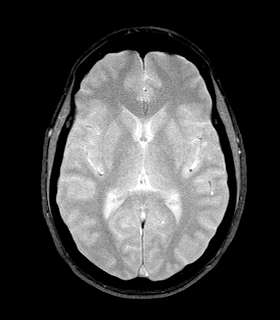
[im 25/25]
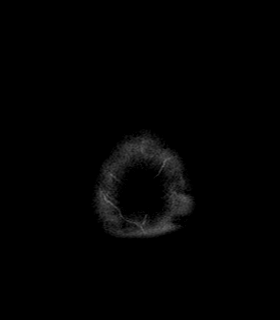

[Series 8: FLAIR · sagittal · 5.0mm · 0.45mm/px · 3 of 23 slices shown (2 of 2)]
[im 1/23]
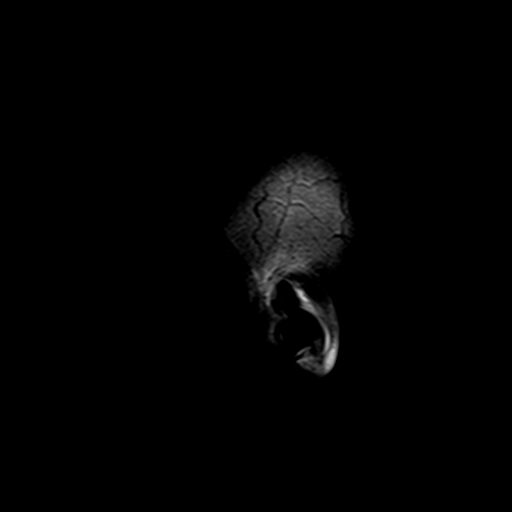
[im 12/23]
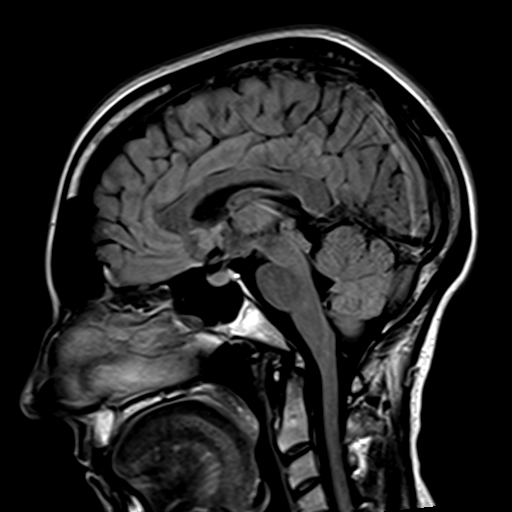
[im 23/23]
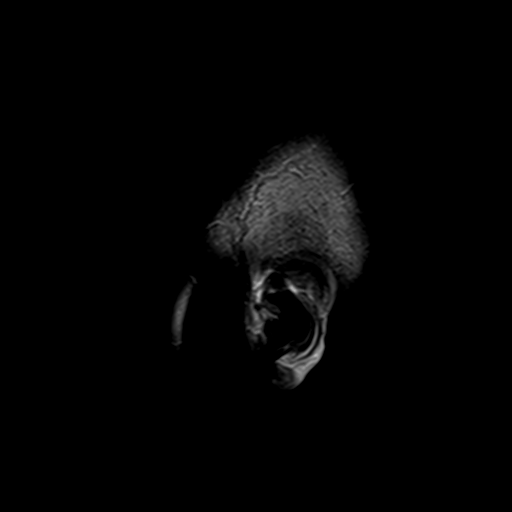

[Series 10: T2 post-contrast · coronal · 5.0mm · 0.45mm/px · 3 of 29 slices shown]
[im 1/29]
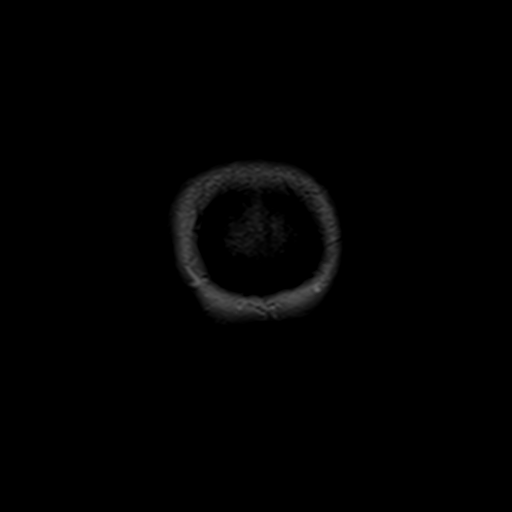
[im 15/29]
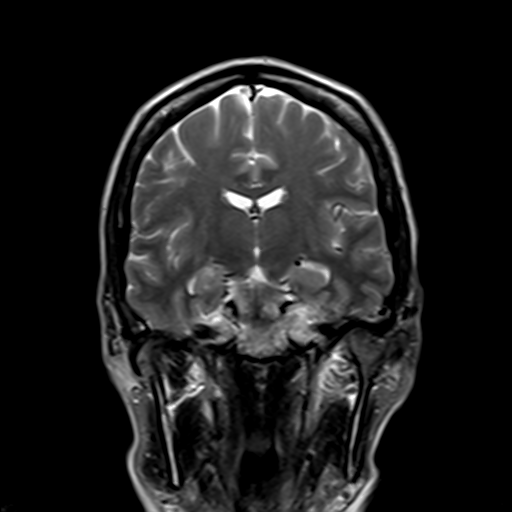
[im 29/29]
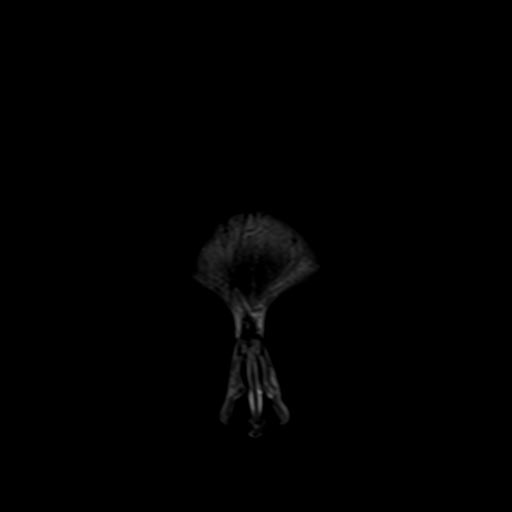

[Series 11: T1 post-contrast · axial · 3.0mm · 1.00mm/px · z∈[-56,+132]mm · 7 of 64 slices shown (1 of 2)]
[im 1/64]
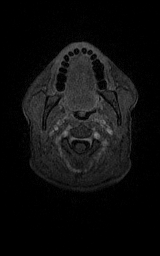
[im 11/64]
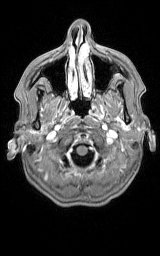
[im 22/64]
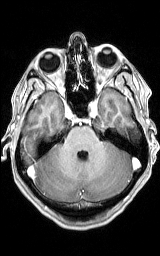
[im 32/64]
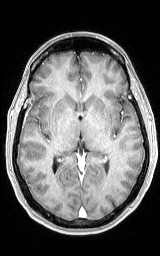
[im 43/64]
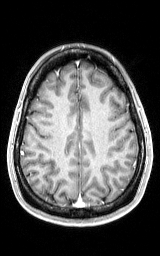
[im 53/64]
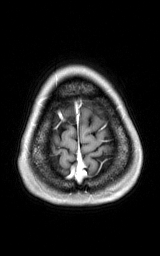
[im 64/64]
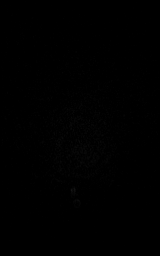

[Series 12: T1 post-contrast · coronal · 5.0mm · 0.45mm/px · 3 of 29 slices shown (2 of 2)]
[im 1/29]
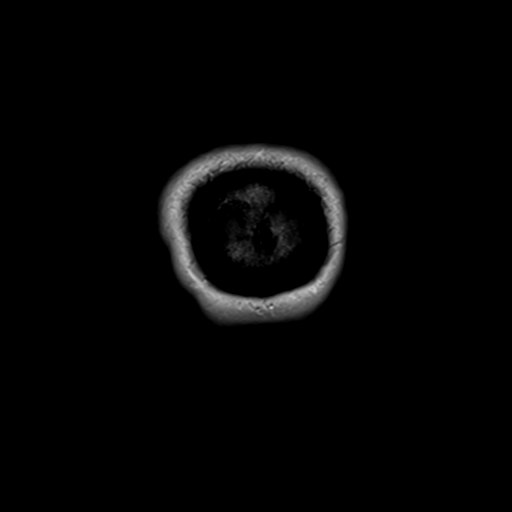
[im 15/29]
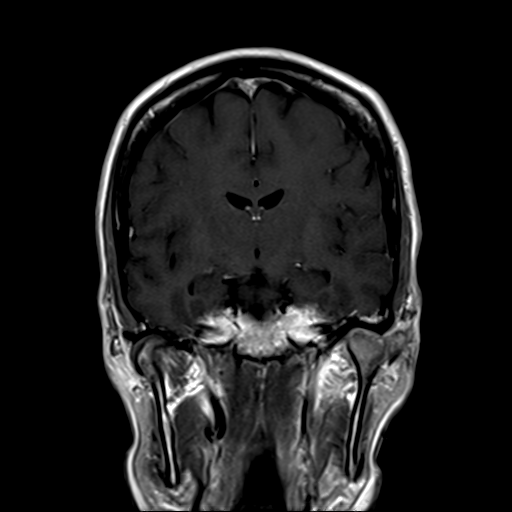
[im 29/29]
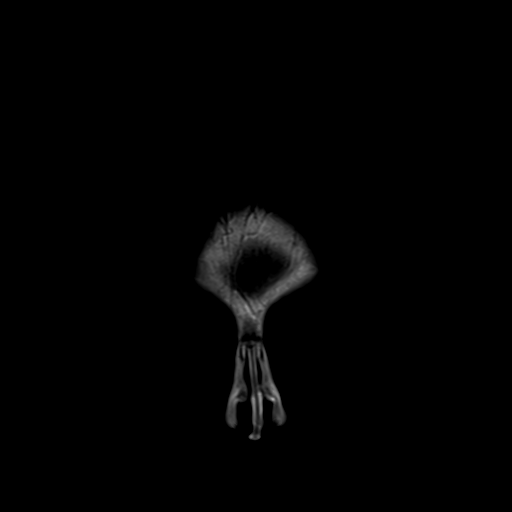

[Series 100: DWI · axial · 3.0mm · 1.20mm/px · z∈[-39,+120]mm · 6 of 54 slices shown (2 of 2)]
[im 1/54]
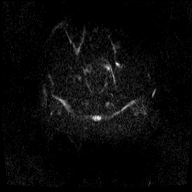
[im 11/54]
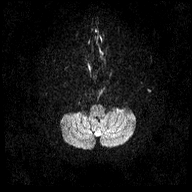
[im 22/54]
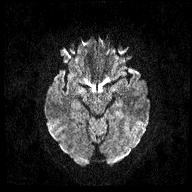
[im 32/54]
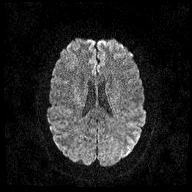
[im 43/54]
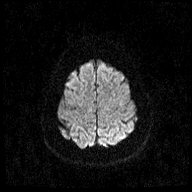
[im 54/54]
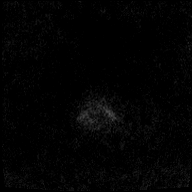

[39 of 48 positions shown; findings below may reference images not displayed]

FINDINGS: Brain: Ventricle size normal. Cerebral volume normal. Negative for
acute or chronic infarction. Negative for demyelinating disease.
Cerebral white matter is normal. Brainstem and cerebellum normal.

Negative for hemorrhage or mass. No shift of the midline structures.

Normal enhancement following contrast infusion.

Vascular: Normal arterial flow voids.

Skull and upper cervical spine:  Negative

Sinuses/Orbits: None

Other: None
IMPRESSION: Negative MRI of the brain.  No change from [id number]

## 2019-10-21 ENCOUNTER — Ambulatory Visit: Payer: Self-pay | Attending: Internal Medicine

## 2019-10-21 DIAGNOSIS — Z23 Encounter for immunization: Secondary | ICD-10-CM

## 2019-10-21 NOTE — Progress Notes (Signed)
   Covid-19 Vaccination Clinic  Name:  Laynie Espy    MRN: [id number] DOB: Aug 07, 1965  10/21/2019  Ms. Pavelko was observed post Covid-19 immunization for 15 minutes without incident. She was provided with Vaccine Information Sheet and instruction to access the V-Safe system.   Ms. Browning was instructed to call 911 with any severe reactions post vaccine: Marland Kitchen Difficulty breathing  . Swelling of face and throat  . A fast heartbeat  . A bad rash all over body  . Dizziness and weakness   Immunizations Administered    Name Date Dose VIS Date Route   Pfizer COVID-19 Vaccine 10/21/2019  3:45 PM 0.3 mL 07/12/2019 Intramuscular   Manufacturer: [employer], [employer]   Lot: [id number]   NDC: [address]-3

## 2019-11-11 ENCOUNTER — Ambulatory Visit: Payer: Self-pay | Attending: Internal Medicine

## 2019-11-11 DIAGNOSIS — Z23 Encounter for immunization: Secondary | ICD-10-CM

## 2019-11-11 NOTE — Progress Notes (Signed)
   Covid-19 Vaccination Clinic  Name:  Shondra Capps    MRN: [id number] DOB: 09-Jun-1966  11/11/2019  Ms. Grimley was observed post Covid-19 immunization for 15 minutes without incident. She was provided with Vaccine Information Sheet and instruction to access the V-Safe system.   Ms. Lemar was instructed to call 911 with any severe reactions post vaccine: Marland Kitchen Difficulty breathing  . Swelling of face and throat  . A fast heartbeat  . A bad rash all over body  . Dizziness and weakness   Immunizations Administered    Name Date Dose VIS Date Route   Pfizer COVID-19 Vaccine 11/11/2019  3:24 PM 0.3 mL 07/12/2019 Intramuscular   Manufacturer: [employer], [employer]   Lot: [id number]   NDC: [address]-3
